# Patient Record
Sex: Female | Born: 1950 | Race: Black or African American | Hispanic: No | Marital: Single
Health system: Southern US, Community
[De-identification: ages and names within clinical notes are randomized; demographics above are authoritative.]

## PROBLEM LIST (undated history)

## (undated) DIAGNOSIS — E119 Type 2 diabetes mellitus without complications: Secondary | ICD-10-CM

## (undated) DIAGNOSIS — I10 Essential (primary) hypertension: Secondary | ICD-10-CM

## (undated) DIAGNOSIS — E7800 Pure hypercholesterolemia, unspecified: Secondary | ICD-10-CM

---

## 2005-09-25 ENCOUNTER — Other Ambulatory Visit: Admission: RE | Admit: 2005-09-25 | Discharge: 2005-09-25 | Payer: Self-pay | Admitting: Family Medicine

## 2005-10-08 ENCOUNTER — Encounter: Admission: RE | Admit: 2005-10-08 | Discharge: 2005-10-08 | Payer: Self-pay | Admitting: Family Medicine

## 2006-10-10 ENCOUNTER — Encounter: Admission: RE | Admit: 2006-10-10 | Discharge: 2006-10-10 | Payer: Self-pay | Admitting: Family Medicine

## 2006-10-31 ENCOUNTER — Other Ambulatory Visit: Admission: RE | Admit: 2006-10-31 | Discharge: 2006-10-31 | Payer: Self-pay | Admitting: Family Medicine

## 2006-12-31 ENCOUNTER — Encounter: Admission: RE | Admit: 2006-12-31 | Discharge: 2006-12-31 | Payer: Self-pay | Admitting: Family Medicine

## 2007-10-15 ENCOUNTER — Encounter: Admission: RE | Admit: 2007-10-15 | Discharge: 2007-10-15 | Payer: Self-pay | Admitting: Family Medicine

## 2008-10-20 ENCOUNTER — Encounter: Admission: RE | Admit: 2008-10-20 | Discharge: 2008-10-20 | Payer: Self-pay | Admitting: Family Medicine

## 2008-10-24 ENCOUNTER — Encounter: Admission: RE | Admit: 2008-10-24 | Discharge: 2008-10-24 | Payer: Self-pay | Admitting: Family Medicine

## 2011-10-22 ENCOUNTER — Ambulatory Visit: Payer: Self-pay | Admitting: Family Medicine

## 2011-10-29 ENCOUNTER — Ambulatory Visit: Payer: Self-pay | Admitting: Family Medicine

## 2012-04-22 ENCOUNTER — Ambulatory Visit: Payer: Self-pay | Admitting: Women's Health

## 2012-04-23 ENCOUNTER — Ambulatory Visit: Payer: Self-pay | Admitting: Women's Health

## 2013-10-12 ENCOUNTER — Other Ambulatory Visit (HOSPITAL_COMMUNITY): Payer: Self-pay | Admitting: Family Medicine

## 2013-10-12 DIAGNOSIS — Z1231 Encounter for screening mammogram for malignant neoplasm of breast: Secondary | ICD-10-CM

## 2013-10-15 ENCOUNTER — Ambulatory Visit (HOSPITAL_COMMUNITY): Payer: Self-pay

## 2013-11-29 ENCOUNTER — Ambulatory Visit: Payer: Self-pay | Admitting: Family Medicine

## 2017-01-01 ENCOUNTER — Encounter (HOSPITAL_COMMUNITY): Payer: Self-pay | Admitting: Emergency Medicine

## 2017-01-01 ENCOUNTER — Ambulatory Visit (HOSPITAL_COMMUNITY)
Admission: EM | Admit: 2017-01-01 | Discharge: 2017-01-01 | Disposition: A | Discharge status: Home / Self Care | Payer: Medicare HMO | Attending: Family Medicine | Admitting: Family Medicine

## 2017-01-01 DIAGNOSIS — Z041 Encounter for examination and observation following transport accident: Secondary | ICD-10-CM | POA: Diagnosis not present

## 2017-01-01 HISTORY — DX: Pure hypercholesterolemia, unspecified: E78.00

## 2017-01-01 HISTORY — DX: Type 2 diabetes mellitus without complications: E11.9

## 2017-01-01 HISTORY — DX: Essential (primary) hypertension: I10
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
## 2019-09-20 ENCOUNTER — Other Ambulatory Visit (HOSPITAL_BASED_OUTPATIENT_CLINIC_OR_DEPARTMENT_OTHER): Payer: Self-pay | Admitting: *Deleted

## 2019-09-24 ENCOUNTER — Other Ambulatory Visit (HOSPITAL_BASED_OUTPATIENT_CLINIC_OR_DEPARTMENT_OTHER): Payer: Self-pay | Admitting: *Deleted

## 2019-09-24 ENCOUNTER — Other Ambulatory Visit: Payer: Self-pay

## 2019-09-24 ENCOUNTER — Ambulatory Visit (HOSPITAL_BASED_OUTPATIENT_CLINIC_OR_DEPARTMENT_OTHER)
Admission: RE | Admit: 2019-09-24 | Discharge: 2019-09-24 | Disposition: A | Discharge status: Home / Self Care | Payer: Medicare HMO | Source: Ambulatory Visit | Attending: *Deleted | Admitting: *Deleted

## 2019-09-24 ENCOUNTER — Encounter (HOSPITAL_BASED_OUTPATIENT_CLINIC_OR_DEPARTMENT_OTHER): Payer: Self-pay

## 2019-09-24 DIAGNOSIS — R911 Solitary pulmonary nodule: Secondary | ICD-10-CM | POA: Diagnosis not present

## 2019-09-24 MED ORDER — IOHEXOL 300 MG/ML  SOLN
100.0000 mL | Freq: Once | INTRAMUSCULAR | Status: AC | PRN
  Administered 2019-09-24: 17:00:00 80 mL via INTRAVENOUS

## 2020-04-21 ENCOUNTER — Other Ambulatory Visit: Payer: Self-pay | Admitting: Physician Assistant

## 2020-04-21 DIAGNOSIS — Z1231 Encounter for screening mammogram for malignant neoplasm of breast: Secondary | ICD-10-CM

## 2020-06-06 ENCOUNTER — Ambulatory Visit: Payer: Medicare HMO

## 2021-04-03 ENCOUNTER — Other Ambulatory Visit: Payer: Self-pay

## 2021-04-03 ENCOUNTER — Ambulatory Visit
Admission: RE | Admit: 2021-04-03 | Discharge: 2021-04-03 | Disposition: A | Discharge status: Home / Self Care | Payer: Medicare HMO | Source: Ambulatory Visit | Attending: Physician Assistant | Admitting: Physician Assistant

## 2021-04-03 DIAGNOSIS — Z1231 Encounter for screening mammogram for malignant neoplasm of breast: Secondary | ICD-10-CM

## 2022-02-25 ENCOUNTER — Other Ambulatory Visit: Payer: Self-pay | Admitting: Physician Assistant

## 2022-02-25 DIAGNOSIS — Z1231 Encounter for screening mammogram for malignant neoplasm of breast: Secondary | ICD-10-CM

## 2022-03-20 ENCOUNTER — Ambulatory Visit
Admission: RE | Admit: 2022-03-20 | Discharge: 2022-03-20 | Disposition: A | Discharge status: Home / Self Care | Payer: Medicare Other | Source: Ambulatory Visit | Attending: Physician Assistant | Admitting: Physician Assistant

## 2022-03-20 DIAGNOSIS — Z1231 Encounter for screening mammogram for malignant neoplasm of breast: Secondary | ICD-10-CM

## 2022-06-03 IMAGING — MG MM DIGITAL SCREENING BILAT W/ TOMO AND CAD
8 series · 8 of 24 positions shown · non-contrast
Comparison: Previous exam(s).

CLINICAL DATA: Screening.

EXAM:
DIGITAL SCREENING BILATERAL MAMMOGRAM WITH TOMOSYNTHESIS AND CAD
TECHNIQUE: Bilateral screening digital craniocaudal and mediolateral oblique
mammograms were obtained. Bilateral screening digital breast
tomosynthesis was performed. The images were evaluated with
computer-aided detection.

[R CC synth-2D]
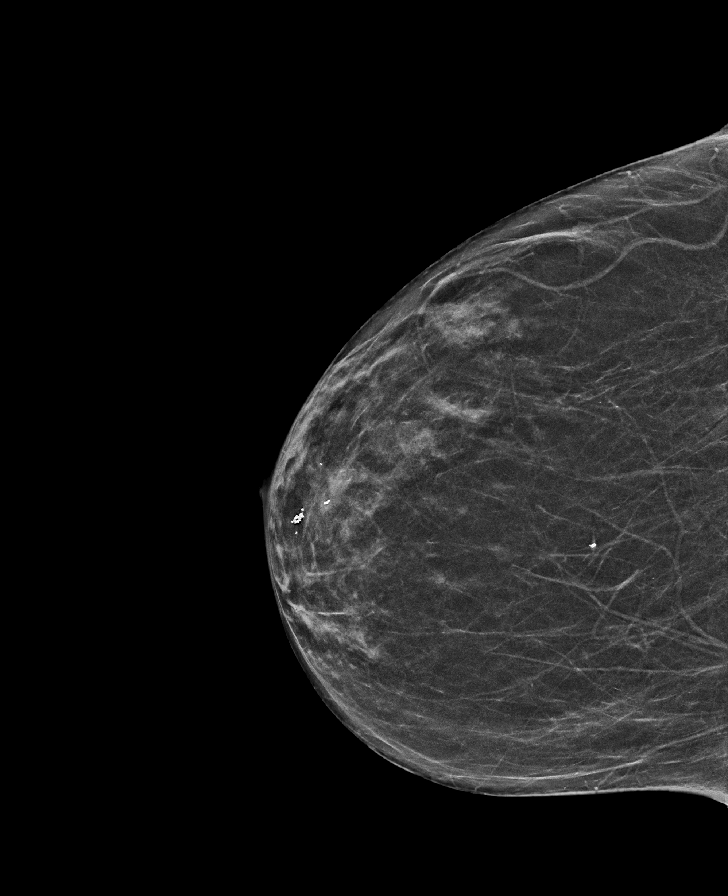

[L MLO synth-2D]
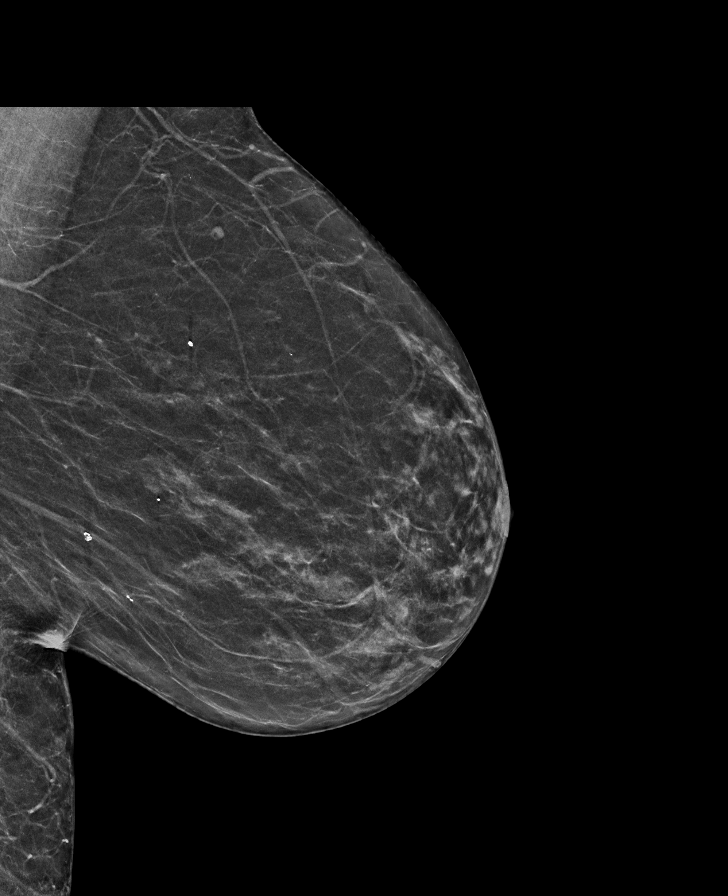

[R MLO synth-2D]
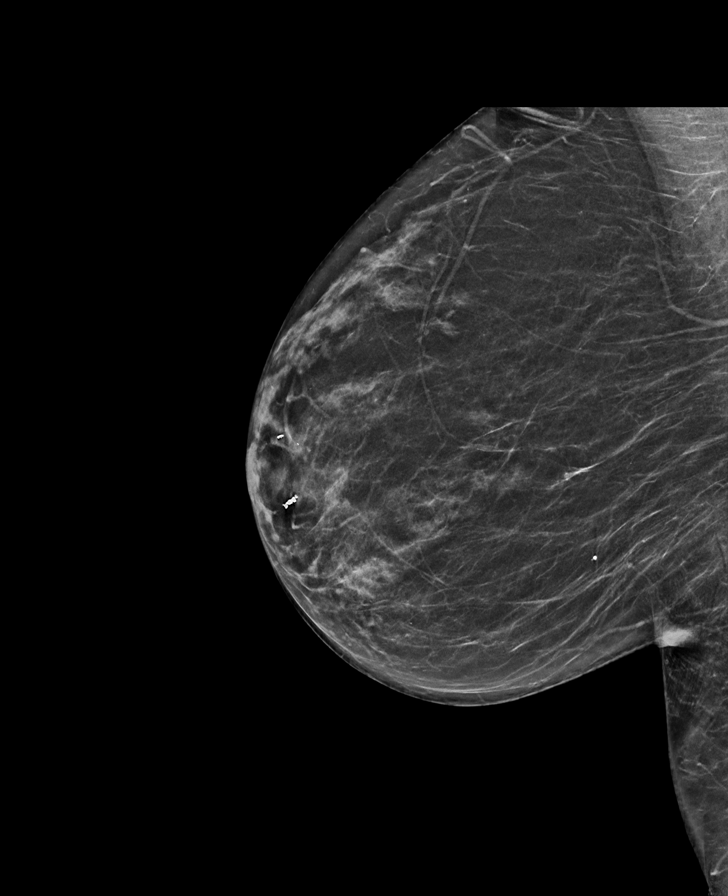

[L CC synth-2D]
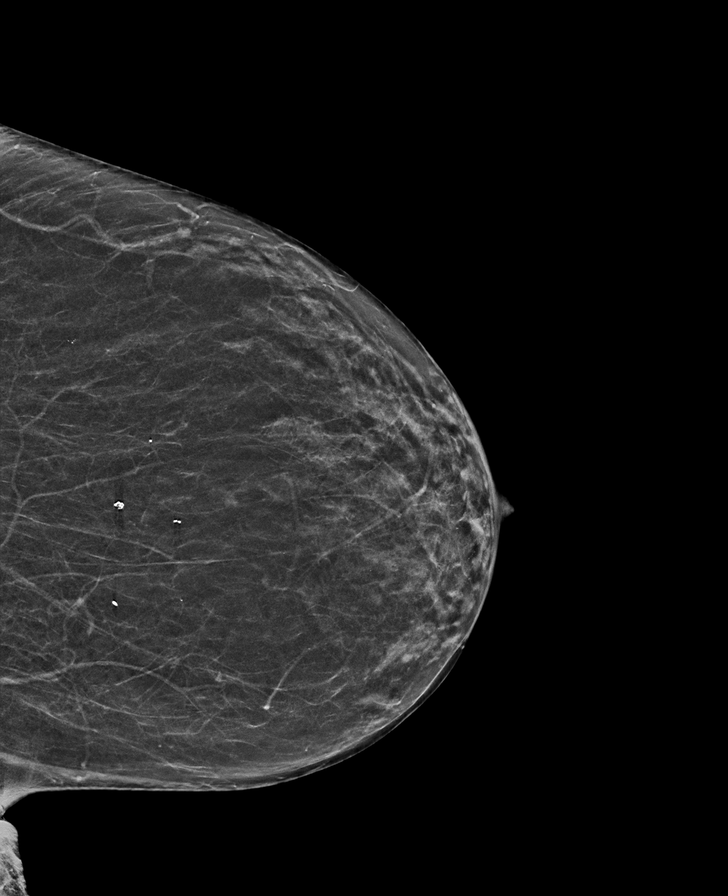

[R MLO tomo · tomo slice 31/60.0]
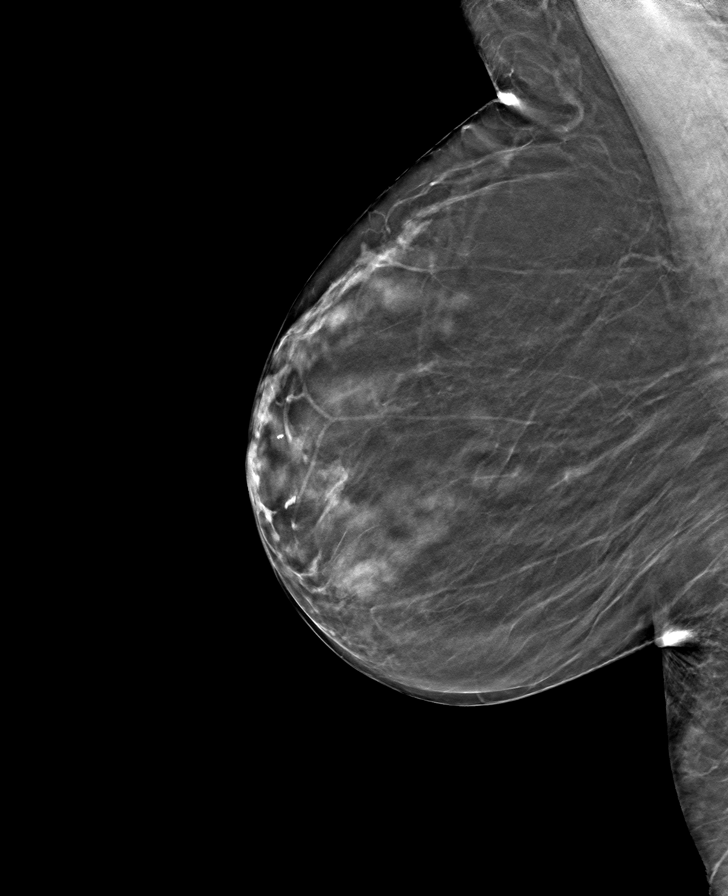

[R CC tomo · tomo slice 29/56.0]
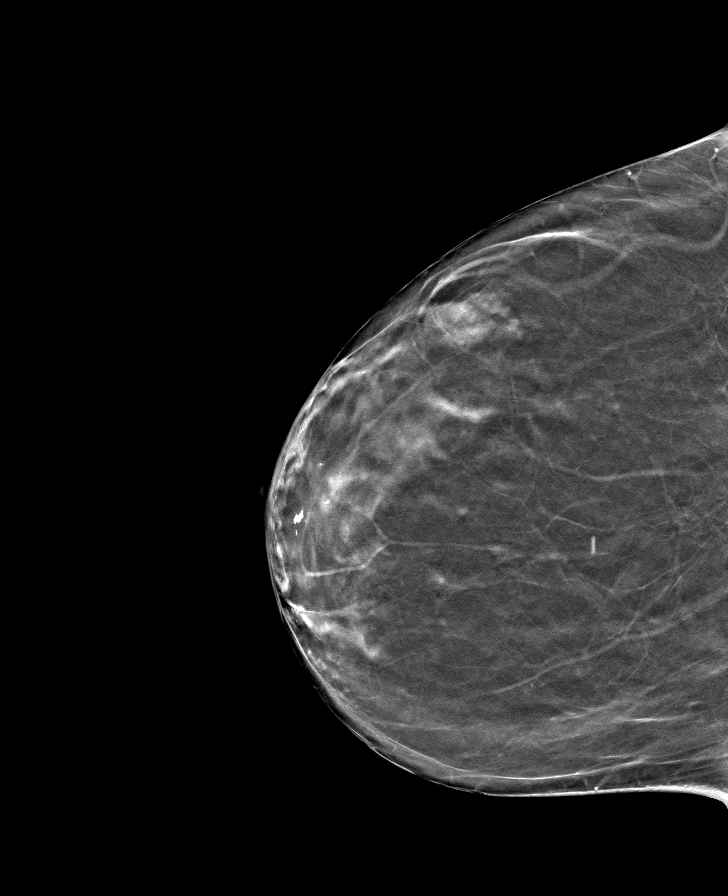

[L CC tomo · tomo slice 28/55.0]
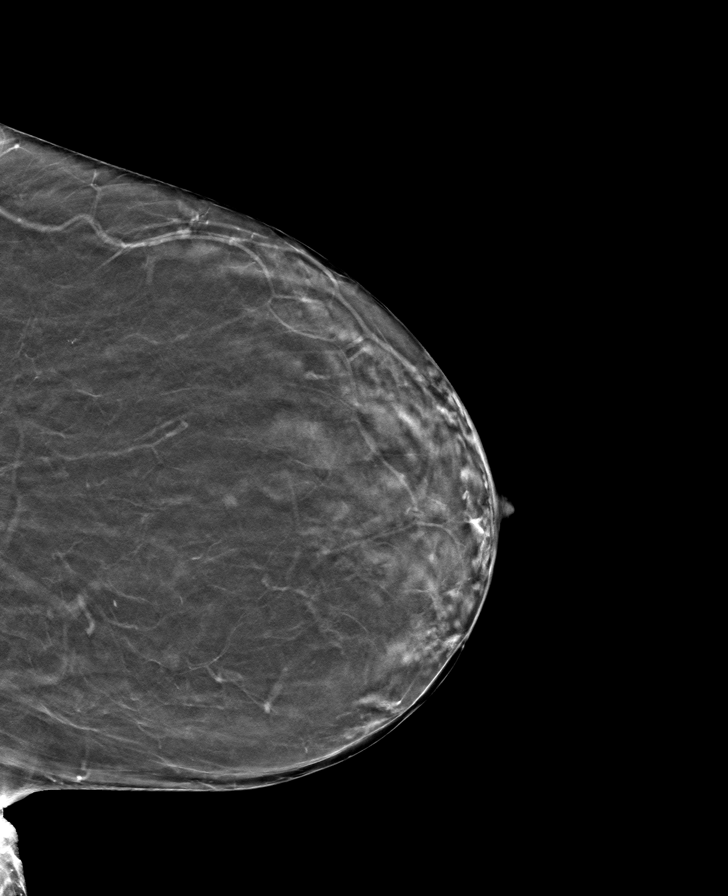

[L MLO tomo · tomo slice 29/58.0]
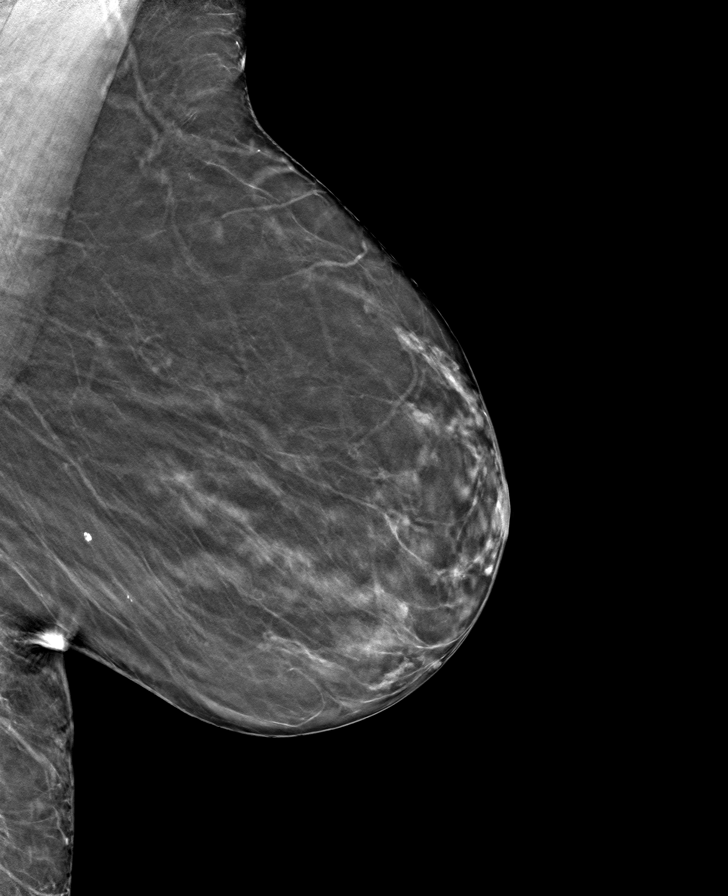

[8 of 24 positions shown; findings below may reference images not displayed]

ACR Breast Density Category b: There are scattered areas of
fibroglandular density.
FINDINGS: There are no findings suspicious for malignancy.
IMPRESSION: No mammographic evidence of malignancy. A result letter of this
screening mammogram will be mailed directly to the patient.

RECOMMENDATION:
Screening mammogram in one year. (Code:51-O-LD2)

BI-RADS CATEGORY  1: Negative.
